# Patient Record
Sex: Female | Born: 1960 | Race: White | Hispanic: No | Marital: Married | Smoking: Never smoker
Health system: Southern US, Community
[De-identification: ages and names within clinical notes are randomized; demographics above are authoritative.]

## PROBLEM LIST (undated history)

## (undated) DIAGNOSIS — K648 Other hemorrhoids: Secondary | ICD-10-CM

## (undated) DIAGNOSIS — K5790 Diverticulosis of intestine, part unspecified, without perforation or abscess without bleeding: Secondary | ICD-10-CM

## (undated) DIAGNOSIS — D72819 Decreased white blood cell count, unspecified: Secondary | ICD-10-CM

## (undated) HISTORY — DX: Diverticulosis of intestine, part unspecified, without perforation or abscess without bleeding: K57.90

## (undated) HISTORY — PX: COLONOSCOPY: SHX174

## (undated) HISTORY — PX: ENDOMETRIAL ABLATION: SHX621

## (undated) HISTORY — DX: Other hemorrhoids: K64.8

## (undated) HISTORY — DX: Decreased white blood cell count, unspecified: D72.819

## (undated) HISTORY — PX: TUBAL LIGATION: SHX77

---

## 2004-07-04 ENCOUNTER — Ambulatory Visit: Payer: Self-pay | Admitting: Obstetrics and Gynecology

## 2005-09-13 ENCOUNTER — Ambulatory Visit: Payer: Self-pay | Admitting: Obstetrics and Gynecology

## 2006-10-24 ENCOUNTER — Ambulatory Visit: Payer: Self-pay | Admitting: Obstetrics and Gynecology

## 2006-11-14 ENCOUNTER — Ambulatory Visit: Payer: Self-pay | Admitting: Obstetrics and Gynecology

## 2007-11-15 ENCOUNTER — Ambulatory Visit: Payer: Self-pay | Admitting: Obstetrics and Gynecology

## 2008-11-23 ENCOUNTER — Ambulatory Visit: Payer: Self-pay | Admitting: Obstetrics and Gynecology

## 2009-07-02 ENCOUNTER — Emergency Department: Payer: Self-pay | Admitting: Emergency Medicine

## 2009-12-16 ENCOUNTER — Ambulatory Visit: Payer: Self-pay | Admitting: Obstetrics and Gynecology

## 2010-12-30 ENCOUNTER — Ambulatory Visit: Payer: Self-pay | Admitting: Obstetrics and Gynecology

## 2012-01-01 ENCOUNTER — Ambulatory Visit: Payer: Self-pay | Admitting: Obstetrics and Gynecology

## 2013-01-01 ENCOUNTER — Ambulatory Visit: Payer: Self-pay | Admitting: Obstetrics and Gynecology

## 2014-01-22 ENCOUNTER — Ambulatory Visit: Payer: Self-pay | Admitting: Obstetrics and Gynecology

## 2014-12-31 ENCOUNTER — Other Ambulatory Visit: Payer: Self-pay | Admitting: Obstetrics and Gynecology

## 2014-12-31 DIAGNOSIS — Z1231 Encounter for screening mammogram for malignant neoplasm of breast: Secondary | ICD-10-CM

## 2015-01-25 ENCOUNTER — Ambulatory Visit
Admission: RE | Admit: 2015-01-25 | Discharge: 2015-01-25 | Disposition: A | Discharge status: Home / Self Care | Payer: 59 | Source: Ambulatory Visit | Attending: Obstetrics and Gynecology | Admitting: Obstetrics and Gynecology

## 2015-01-25 DIAGNOSIS — Z1231 Encounter for screening mammogram for malignant neoplasm of breast: Secondary | ICD-10-CM | POA: Diagnosis present

## 2015-12-28 ENCOUNTER — Other Ambulatory Visit: Payer: Self-pay | Admitting: Obstetrics and Gynecology

## 2015-12-28 DIAGNOSIS — Z1231 Encounter for screening mammogram for malignant neoplasm of breast: Secondary | ICD-10-CM

## 2016-01-26 ENCOUNTER — Ambulatory Visit
Admission: RE | Admit: 2016-01-26 | Discharge: 2016-01-26 | Disposition: A | Discharge status: Home / Self Care | Payer: 59 | Source: Ambulatory Visit | Attending: Obstetrics and Gynecology | Admitting: Obstetrics and Gynecology

## 2016-01-26 ENCOUNTER — Other Ambulatory Visit: Payer: Self-pay | Admitting: Obstetrics and Gynecology

## 2016-01-26 DIAGNOSIS — Z1231 Encounter for screening mammogram for malignant neoplasm of breast: Secondary | ICD-10-CM

## 2016-03-03 ENCOUNTER — Ambulatory Visit: Payer: 59 | Admitting: Internal Medicine

## 2016-03-09 ENCOUNTER — Inpatient Hospital Stay: Payer: 59 | Attending: Internal Medicine | Admitting: Internal Medicine

## 2016-03-09 ENCOUNTER — Encounter: Payer: Self-pay | Admitting: *Deleted

## 2016-03-09 ENCOUNTER — Encounter (INDEPENDENT_AMBULATORY_CARE_PROVIDER_SITE_OTHER): Payer: Self-pay

## 2016-03-09 DIAGNOSIS — D649 Anemia, unspecified: Secondary | ICD-10-CM

## 2016-03-09 DIAGNOSIS — K5790 Diverticulosis of intestine, part unspecified, without perforation or abscess without bleeding: Secondary | ICD-10-CM

## 2016-03-09 DIAGNOSIS — D72819 Decreased white blood cell count, unspecified: Secondary | ICD-10-CM | POA: Insufficient documentation

## 2016-03-09 DIAGNOSIS — Z79899 Other long term (current) drug therapy: Secondary | ICD-10-CM | POA: Insufficient documentation

## 2016-03-09 DIAGNOSIS — K649 Unspecified hemorrhoids: Secondary | ICD-10-CM | POA: Diagnosis not present

## 2016-03-09 NOTE — Assessment & Plan Note (Addendum)
White count 3.6-3.8-normal differential; normal hemoglobin and platelets. Unclear etiology; however asymptomatic. Recommend checking CBC with differential/viral studies including hepatitis and HIV; LDH. Discussed the potential- causes including but not limited to benign versus malignant causes.   # For now recommend checking CBC every 6 weeks; follow up with me in 3 months. If her labs are abnormal/continued trend down recommend a bone marrow biopsy; CT scans. This was also discussed.  # Patient will have labs and through [place]. Follow-up with me in 3 months.  Thank you Dr.Schermerhorn for allowing me to participate in the care of your pleasant patient. Please do not hesitate to contact me with questions or concerns in the interim.

## 2016-03-09 NOTE — Progress Notes (Signed)
[facility] NOTE  Patient Care Team: Boykin Nearing, MD as PCP - General (Obstetrics and Gynecology)  CHIEF COMPLAINTS/PURPOSE OF CONSULTATION:    # LEUCOPENIA-   #    No history exists.     HISTORY OF PRESENTING ILLNESS:  Kristie Hampton 55 y.o.  female with no significant past medical problems-noted to have slightly low white count on a regular physical exams/labs through her gynecologist. She has been referred to [place] for further workup.  Patient denies any new medications. Denies new onset of weight loss or night sweats or fevers. No frequent infections. Denies any significant alcohol abuse.   Recently had joint pain deferred to orthopedic. ROS: A complete 10 point review of system is done which is negative except mentioned above in history of present illness  MEDICAL HISTORY:  Past Medical History:  Diagnosis Date  . Diverticulosis   . Internal hemorrhoids   . Leukopenia     SURGICAL HISTORY: Past Surgical History:  Procedure Laterality Date  . COLONOSCOPY    . ENDOMETRIAL ABLATION    . TUBAL LIGATION      SOCIAL HISTORY: [facility]; no smoking; occasional alcohol. Lives in [place].  Social History   Social History  . Marital status: Married    Spouse name: N/A  . Number of children: N/A  . Years of education: N/A   Occupational History  . Not on file.   Social History Main Topics  . Smoking status: Never Smoker  . Smokeless tobacco: Never Used  . Alcohol use 1.8 oz/week    3 Glasses of wine per week  . Drug use: No  . Sexual activity: Not on file   Other Topics Concern  . Not on file   Social History Narrative  . No narrative on file    FAMILY HISTORY: Family History  Problem Relation Age of Onset  . Breast cancer Maternal Grandmother 61  . Heart disease Father     ALLERGIES:  is allergic to codeine.  MEDICATIONS:  Current Outpatient Prescriptions  Medication Sig Dispense Refill  . Calcium  Carbonate-Vitamin D (CALTRATE 600+D) 600-400 MG-UNIT tablet Take 1 tablet by mouth daily.    Marland Kitchen esomeprazole (NEXIUM) 20 MG capsule Take 20 mg by mouth daily at 12 noon.    . fexofenadine (ALLEGRA) 30 MG tablet Take 30 mg by mouth 2 (two) times daily.    . meloxicam (MOBIC) 7.5 MG tablet Take by mouth.    . Multiple Vitamin (MULTI-VITAMINS) TABS Take by mouth.     No current facility-administered medications for this visit.       Marland Kitchen  PHYSICAL EXAMINATION:   Vitals:   03/09/16 1413  BP: (!) 164/99  Pulse: 78  Resp: 16  Temp: 97.3 F (36.3 C)   Filed Weights   03/09/16 1413  Weight: 149 lb 12.8 oz (67.9 kg)    GENERAL: Well-nourished well-developed; Alert, no distress and comfortable.   Alone.  EYES: no pallor or icterus OROPHARYNX: no thrush or ulceration; good dentition  NECK: supple, no masses felt LYMPH:  no palpable lymphadenopathy in the cervical, axillary or inguinal regions LUNGS: clear to auscultation and  No wheeze or crackles HEART/CVS: regular rate & rhythm and no murmurs; No lower extremity edema ABDOMEN: abdomen soft, non-tender and normal bowel sounds Musculoskeletal:no cyanosis of digits and no clubbing  PSYCH: alert & oriented x 3 with fluent speech NEURO: no focal motor/sensory deficits SKIN:  no rashes or significant lesions  LABORATORY DATA:  I  have reviewed the data as listed No results found for: WBC, HGB, HCT, MCV, PLT No results for input(s): NA, K, CL, CO2, GLUCOSE, BUN, CREATININE, CALCIUM, GFRNONAA, GFRAA, PROT, ALBUMIN, AST, ALT, ALKPHOS, BILITOT, BILIDIR, IBILI in the last 8760 hours.  RADIOGRAPHIC STUDIES: I have personally reviewed the radiological images as listed and agreed with the findings in the report. No results found.  ASSESSMENT & PLAN:   Leucopenia White count 3.6-3.8-normal differential; normal hemoglobin and platelets. Unclear etiology; however asymptomatic. Recommend checking CBC with differential/viral studies including  hepatitis and HIV; LDH. Discussed the potential- causes including but not limited to benign versus malignant causes.   # For now recommend checking CBC every 6 weeks; follow up with me in 3 months. If her labs are abnormal/continued trend down recommend a bone marrow biopsy. This was also discussed.  # Patient will have labs and through [facility]. Follow-up with me in 3 months.  Thank you for allowing me to participate in the care of your pleasant patient. Please do not hesitate to contact me with questions or concerns in the interim.     All questions were answered. The patient knows to call the clinic with any problems, questions or concerns.       Cammie Sickle, MD 03/09/2016 5:25 PM

## 2016-03-10 ENCOUNTER — Encounter: Payer: Self-pay | Admitting: Internal Medicine

## 2016-03-17 ENCOUNTER — Telehealth: Payer: Self-pay | Admitting: Internal Medicine

## 2016-03-17 ENCOUNTER — Other Ambulatory Visit: Payer: Self-pay | Admitting: Internal Medicine

## 2016-03-17 NOTE — Telephone Encounter (Signed)
Please inform patient that labs within normal limits; repeat labs as planned. Recommendations

## 2016-03-21 ENCOUNTER — Telehealth: Payer: Self-pay | Admitting: Internal Medicine

## 2016-03-21 NOTE — Telephone Encounter (Signed)
Left vm-for patient return our call.

## 2016-03-21 NOTE — Telephone Encounter (Signed)
She returned your call about her results and asks that you please call her at work: [phone].

## 2016-03-21 NOTE — Telephone Encounter (Signed)
Returned pt's call [phone] with preferred phone #.  Labs wnl. Keep scheduled lab apts @ [facility] and scheduled provider apts.  Teach back process performed with pt.

## 2016-03-21 NOTE — Telephone Encounter (Signed)
Call returned. See Rn notes

## 2016-04-28 ENCOUNTER — Encounter: Payer: Self-pay | Admitting: Internal Medicine

## 2016-06-12 ENCOUNTER — Inpatient Hospital Stay: Payer: 59 | Admitting: Internal Medicine

## 2016-06-14 ENCOUNTER — Encounter: Payer: Self-pay | Admitting: Internal Medicine

## 2016-06-16 ENCOUNTER — Other Ambulatory Visit: Payer: Self-pay | Admitting: Internal Medicine

## 2016-06-19 ENCOUNTER — Encounter (INDEPENDENT_AMBULATORY_CARE_PROVIDER_SITE_OTHER): Payer: Self-pay

## 2016-06-19 ENCOUNTER — Inpatient Hospital Stay: Payer: 59 | Attending: Internal Medicine | Admitting: Internal Medicine

## 2016-06-19 DIAGNOSIS — K5790 Diverticulosis of intestine, part unspecified, without perforation or abscess without bleeding: Secondary | ICD-10-CM | POA: Diagnosis not present

## 2016-06-19 DIAGNOSIS — Z79899 Other long term (current) drug therapy: Secondary | ICD-10-CM | POA: Insufficient documentation

## 2016-06-19 DIAGNOSIS — D72818 Other decreased white blood cell count: Secondary | ICD-10-CM

## 2016-06-19 DIAGNOSIS — Z803 Family history of malignant neoplasm of breast: Secondary | ICD-10-CM | POA: Diagnosis not present

## 2016-06-19 DIAGNOSIS — D72819 Decreased white blood cell count, unspecified: Secondary | ICD-10-CM | POA: Insufficient documentation

## 2016-06-19 DIAGNOSIS — M2550 Pain in unspecified joint: Secondary | ICD-10-CM | POA: Insufficient documentation

## 2016-06-19 DIAGNOSIS — K649 Unspecified hemorrhoids: Secondary | ICD-10-CM | POA: Diagnosis not present

## 2016-06-19 NOTE — Progress Notes (Signed)
Patient here today for follow up.   

## 2016-06-19 NOTE — Assessment & Plan Note (Signed)
White count 3.6-3.8-normal differential- at presentation. Hepatitis workup HIV negative.   Repeat follow-up workup shows normal white count 4.2 hemoglobin normal platelets normal.   Patient likely had benign transient decrease in the WBC count resolved. I would not recommend any further workup including bone marrow or CT scans. Patient is totally asymptomatic.   # Recommend follow-up only as needed.

## 2016-06-19 NOTE — Progress Notes (Signed)
[facility] CONSULT NOTE  Patient Care Team: Suzy Bouchardhomas J Schermerhorn, MD as PCP - General (Obstetrics and Gynecology)  CHIEF COMPLAINTS/PURPOSE OF CONSULTATION:    # LEUCOPENIA-   #    No history exists.     HISTORY OF PRESENTING ILLNESS:  Kristie Hampton 55 y.o.  female with no significant past medical problems-noted to have slightly low white count on a regular physical exams- Is currently a follow-up.  Patient denies any fevers; Patient denies any new medications. Denies new onset of weight loss or night sweats or fevers. No frequent infections. Denies any significant alcohol abuse.   Recently had joint pain deferred to orthopedic. ROS: A complete 10 point review of system is done which is negative except mentioned above in history of present illness  MEDICAL HISTORY:  Past Medical History:  Diagnosis Date  . Diverticulosis   . Internal hemorrhoids   . Leukopenia     SURGICAL HISTORY: Past Surgical History:  Procedure Laterality Date  . COLONOSCOPY    . ENDOMETRIAL ABLATION    . TUBAL LIGATION      SOCIAL HISTORY: [facility]; no smoking; occasional alcohol. [place]  Social History   Social History  . Marital status: Married    Spouse name: N/A  . Number of children: N/A  . Years of education: N/A   Occupational History  . Not on file.   Social History Main Topics  . Smoking status: Never Smoker  . Smokeless tobacco: Never Used  . Alcohol use 1.8 oz/week    3 Glasses of wine per week  . Drug use: No  . Sexual activity: Not on file   Other Topics Concern  . Not on file   Social History Narrative  . No narrative on file    FAMILY HISTORY: Family History  Problem Relation Age of Onset  . Breast cancer Maternal Grandmother [id number]  . Heart disease Father     ALLERGIES:  is allergic to codeine.  MEDICATIONS:  Current Outpatient Prescriptions  Medication Sig Dispense Refill  . Calcium Carbonate-Vitamin D (CALTRATE 600+D)  600-400 MG-UNIT tablet Take 1 tablet by mouth daily.    Marland Kitchen. esomeprazole (NEXIUM) 20 MG capsule Take 20 mg by mouth daily at 12 noon.    . fexofenadine (ALLEGRA) 30 MG tablet Take 30 mg by mouth 2 (two) times daily.    . meloxicam (MOBIC) 7.5 MG tablet Take by mouth.    . Multiple Vitamin (MULTI-VITAMINS) TABS Take by mouth.     No current facility-administered medications for this visit.       Marland Kitchen.  PHYSICAL EXAMINATION:   Vitals:   06/19/16 1559  BP: (!) 166/106  Pulse: 83  Temp: 98.4 F (36.9 C)   Filed Weights   06/19/16 1559  Weight: 152 lb 2 oz (69 kg)    GENERAL: Well-nourished well-developed; Alert, no distress and comfortable.   Alone.  EYES: no pallor or icterus OROPHARYNX: no thrush or ulceration; good dentition  NECK: supple, no masses felt LYMPH:  no palpable lymphadenopathy in the cervical, axillary or inguinal regions LUNGS: clear to auscultation and  No wheeze or crackles HEART/CVS: regular rate & rhythm and no murmurs; No lower extremity edema ABDOMEN: abdomen soft, non-tender and normal bowel sounds Musculoskeletal:no cyanosis of digits and no clubbing  PSYCH: alert & oriented x 3 with fluent speech NEURO: no focal motor/sensory deficits SKIN:  no rashes or significant lesions  LABORATORY DATA:  I have reviewed the data as listed No  results found for: WBC, HGB, HCT, MCV, PLT No results for input(s): NA, K, CL, CO2, GLUCOSE, BUN, CREATININE, CALCIUM, GFRNONAA, GFRAA, PROT, ALBUMIN, AST, ALT, ALKPHOS, BILITOT, BILIDIR, IBILI in the last 8760 hours.  RADIOGRAPHIC STUDIES: I have personally reviewed the radiological images as listed and agreed with the findings in the report. No results found.  ASSESSMENT & PLAN:   Other decreased white blood cell count (CODE) White count 3.6-3.8-normal differential- at presentation. Hepatitis workup HIV negative.   Repeat follow-up workup shows normal white count 4.2 hemoglobin normal platelets normal.   Patient  likely had benign transient decrease in the WBC count resolved. I would not recommend any further workup including bone marrow or CT scans. Patient is totally asymptomatic.   # Recommend follow-up only as needed.   All questions were answered. The patient knows to call the clinic with any problems, questions or concerns.     Earna CoderGovinda R Sydney Hasten, MD 06/20/2016 9:55 AM

## 2017-03-29 ENCOUNTER — Other Ambulatory Visit: Payer: Self-pay | Admitting: Obstetrics and Gynecology

## 2017-03-29 DIAGNOSIS — Z1231 Encounter for screening mammogram for malignant neoplasm of breast: Secondary | ICD-10-CM

## 2017-04-10 ENCOUNTER — Ambulatory Visit
Admission: RE | Admit: 2017-04-10 | Discharge: 2017-04-10 | Disposition: A | Discharge status: Home / Self Care | Payer: 59 | Source: Ambulatory Visit | Attending: Obstetrics and Gynecology | Admitting: Obstetrics and Gynecology

## 2017-04-10 DIAGNOSIS — Z1231 Encounter for screening mammogram for malignant neoplasm of breast: Secondary | ICD-10-CM | POA: Diagnosis present

## 2018-04-09 ENCOUNTER — Other Ambulatory Visit: Payer: Self-pay | Admitting: Obstetrics and Gynecology

## 2018-04-09 DIAGNOSIS — Z1231 Encounter for screening mammogram for malignant neoplasm of breast: Secondary | ICD-10-CM

## 2018-04-17 ENCOUNTER — Ambulatory Visit
Admission: RE | Admit: 2018-04-17 | Discharge: 2018-04-17 | Disposition: A | Discharge status: Home / Self Care | Payer: BLUE CROSS/BLUE SHIELD | Source: Ambulatory Visit | Attending: Obstetrics and Gynecology | Admitting: Obstetrics and Gynecology

## 2018-04-17 DIAGNOSIS — Z1231 Encounter for screening mammogram for malignant neoplasm of breast: Secondary | ICD-10-CM | POA: Insufficient documentation

## 2019-06-24 ENCOUNTER — Other Ambulatory Visit: Payer: Self-pay | Admitting: Obstetrics and Gynecology

## 2019-06-24 DIAGNOSIS — Z1231 Encounter for screening mammogram for malignant neoplasm of breast: Secondary | ICD-10-CM

## 2019-07-01 ENCOUNTER — Other Ambulatory Visit: Payer: Self-pay | Admitting: Obstetrics and Gynecology

## 2019-07-01 ENCOUNTER — Ambulatory Visit
Admission: RE | Admit: 2019-07-01 | Discharge: 2019-07-01 | Disposition: A | Discharge status: Home / Self Care | Payer: BC Managed Care – PPO | Source: Ambulatory Visit | Attending: Obstetrics and Gynecology | Admitting: Obstetrics and Gynecology

## 2019-07-01 DIAGNOSIS — R928 Other abnormal and inconclusive findings on diagnostic imaging of breast: Secondary | ICD-10-CM

## 2019-07-01 DIAGNOSIS — N6310 Unspecified lump in the right breast, unspecified quadrant: Secondary | ICD-10-CM

## 2019-07-01 DIAGNOSIS — Z1231 Encounter for screening mammogram for malignant neoplasm of breast: Secondary | ICD-10-CM | POA: Insufficient documentation

## 2019-07-22 ENCOUNTER — Other Ambulatory Visit: Payer: BC Managed Care – PPO

## 2019-07-22 ENCOUNTER — Ambulatory Visit: Payer: BC Managed Care – PPO

## 2019-07-28 ENCOUNTER — Ambulatory Visit
Admission: RE | Admit: 2019-07-28 | Discharge: 2019-07-28 | Disposition: A | Discharge status: Home / Self Care | Payer: BC Managed Care – PPO | Source: Ambulatory Visit | Attending: Obstetrics and Gynecology | Admitting: Obstetrics and Gynecology

## 2019-07-28 DIAGNOSIS — R928 Other abnormal and inconclusive findings on diagnostic imaging of breast: Secondary | ICD-10-CM

## 2019-07-28 DIAGNOSIS — N6310 Unspecified lump in the right breast, unspecified quadrant: Secondary | ICD-10-CM

## 2019-10-17 ENCOUNTER — Ambulatory Visit: Payer: BC Managed Care – PPO | Attending: Internal Medicine

## 2019-10-17 DIAGNOSIS — Z23 Encounter for immunization: Secondary | ICD-10-CM

## 2019-10-17 NOTE — Progress Notes (Signed)
   Covid-19 Vaccination Clinic  Name:  Kristie Hampton    MRN: [id number] DOB: 1961/04/01  10/17/2019  [name] was observed post Covid-19 immunization for 15 minutes without incident. She was provided with Vaccine Information Sheet and instruction to access the V-Safe system.   [name] was instructed to call 911 with any severe reactions post vaccine: Marland Kitchen Difficulty breathing  . Swelling of face and throat  . A fast heartbeat  . A bad rash all over body  . Dizziness and weakness   Immunizations Administered    Name Date Dose VIS Date Route   Pfizer COVID-19 Vaccine 10/17/2019 11:24 AM 0.3 mL 06/27/2019 Intramuscular   Manufacturer: [employer], [employer]   Lot: [id number]   NDC: [address]-1

## 2019-11-11 ENCOUNTER — Ambulatory Visit: Payer: BC Managed Care – PPO | Attending: Internal Medicine

## 2019-11-11 DIAGNOSIS — Z23 Encounter for immunization: Secondary | ICD-10-CM

## 2019-11-11 NOTE — Progress Notes (Signed)
   Covid-19 Vaccination Clinic  Name:  Gimena Buick    MRN: [id number] DOB: 09/29/1960  11/11/2019  Ms. Comley was observed post Covid-19 immunization for 15 minutes without incident. She was provided with Vaccine Information Sheet and instruction to access the V-Safe system.   Ms. Ashmore was instructed to call 911 with any severe reactions post vaccine: Marland Kitchen Difficulty breathing  . Swelling of face and throat  . A fast heartbeat  . A bad rash all over body  . Dizziness and weakness   Immunizations Administered    Name Date Dose VIS Date Route   Pfizer COVID-19 Vaccine 11/11/2019  4:02 PM 0.3 mL 09/10/2018 Intramuscular   Manufacturer: [employer], [employer]   Lot: [id number]   NDC: [address]-5

## 2020-06-29 ENCOUNTER — Other Ambulatory Visit: Payer: Self-pay | Admitting: Obstetrics and Gynecology

## 2020-06-29 ENCOUNTER — Other Ambulatory Visit: Payer: Self-pay | Admitting: Orthopedic Surgery

## 2020-06-29 DIAGNOSIS — Z1231 Encounter for screening mammogram for malignant neoplasm of breast: Secondary | ICD-10-CM

## 2020-07-14 ENCOUNTER — Other Ambulatory Visit: Payer: Self-pay

## 2020-07-14 ENCOUNTER — Ambulatory Visit
Admission: RE | Admit: 2020-07-14 | Discharge: 2020-07-14 | Disposition: A | Discharge status: Home / Self Care | Payer: BC Managed Care – PPO | Source: Ambulatory Visit | Attending: Obstetrics and Gynecology | Admitting: Obstetrics and Gynecology

## 2020-07-14 DIAGNOSIS — Z1231 Encounter for screening mammogram for malignant neoplasm of breast: Secondary | ICD-10-CM | POA: Insufficient documentation

## 2021-08-10 ENCOUNTER — Other Ambulatory Visit: Payer: Self-pay | Admitting: Obstetrics and Gynecology

## 2021-08-10 DIAGNOSIS — Z1231 Encounter for screening mammogram for malignant neoplasm of breast: Secondary | ICD-10-CM

## 2021-08-23 ENCOUNTER — Other Ambulatory Visit: Payer: Self-pay

## 2021-08-23 ENCOUNTER — Ambulatory Visit
Admission: RE | Admit: 2021-08-23 | Discharge: 2021-08-23 | Disposition: A | Discharge status: Home / Self Care | Payer: BC Managed Care – PPO | Source: Ambulatory Visit | Attending: Obstetrics and Gynecology | Admitting: Obstetrics and Gynecology

## 2021-08-23 DIAGNOSIS — Z1231 Encounter for screening mammogram for malignant neoplasm of breast: Secondary | ICD-10-CM | POA: Insufficient documentation

## 2022-08-14 IMAGING — MG MM DIGITAL SCREENING BILAT W/ TOMO AND CAD
8 series · 8 of 24 positions shown · non-contrast
Comparison: Previous exam(s).

CLINICAL DATA: Screening.

EXAM:
DIGITAL SCREENING BILATERAL MAMMOGRAM WITH TOMOSYNTHESIS AND CAD
TECHNIQUE: Bilateral screening digital craniocaudal and mediolateral oblique
mammograms were obtained. Bilateral screening digital breast
tomosynthesis was performed. The images were evaluated with
computer-aided detection.

[L CC synth-2D]
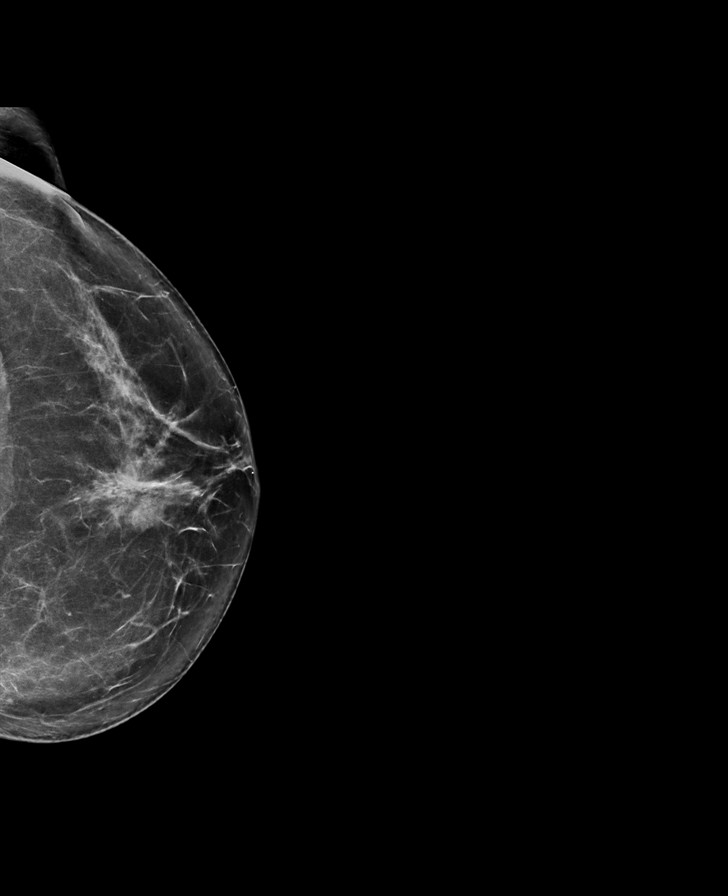

[R MLO synth-2D]
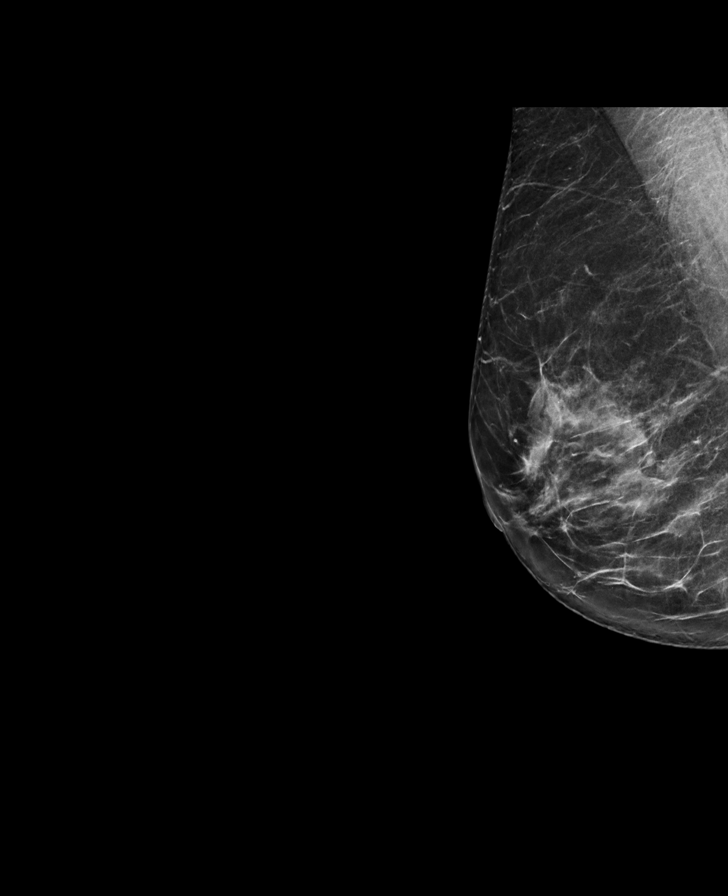

[R CC synth-2D]
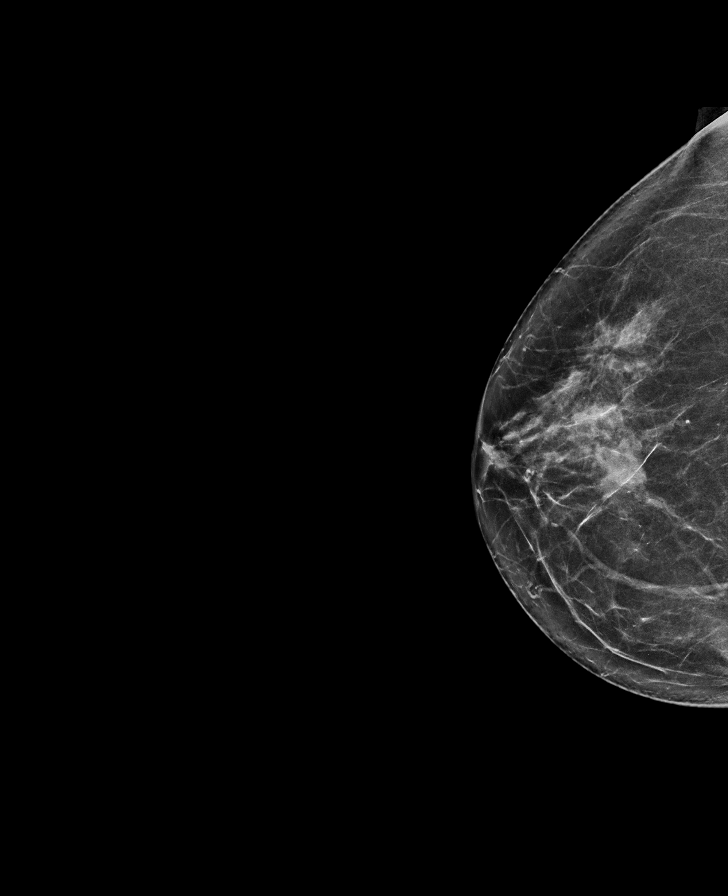

[L MLO synth-2D]
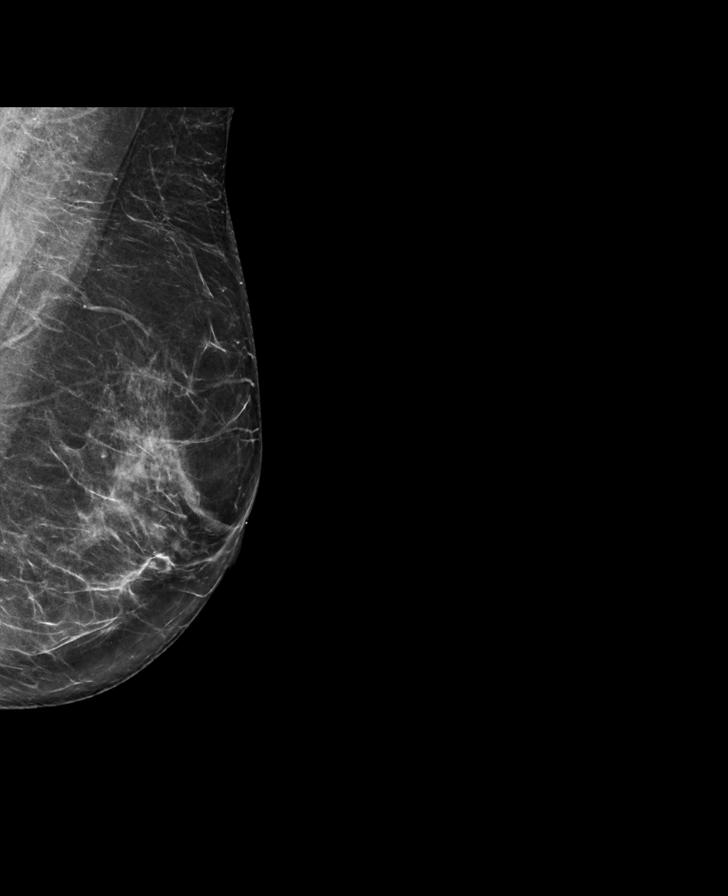

[L CC tomo · tomo slice 38/75.0]
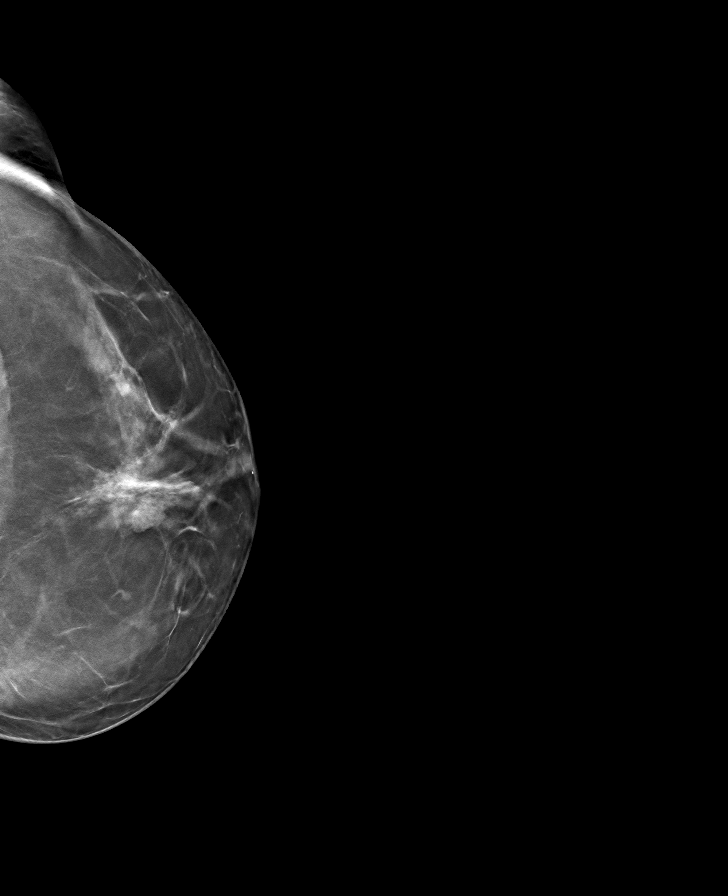

[R MLO tomo · tomo slice 35/70.0]
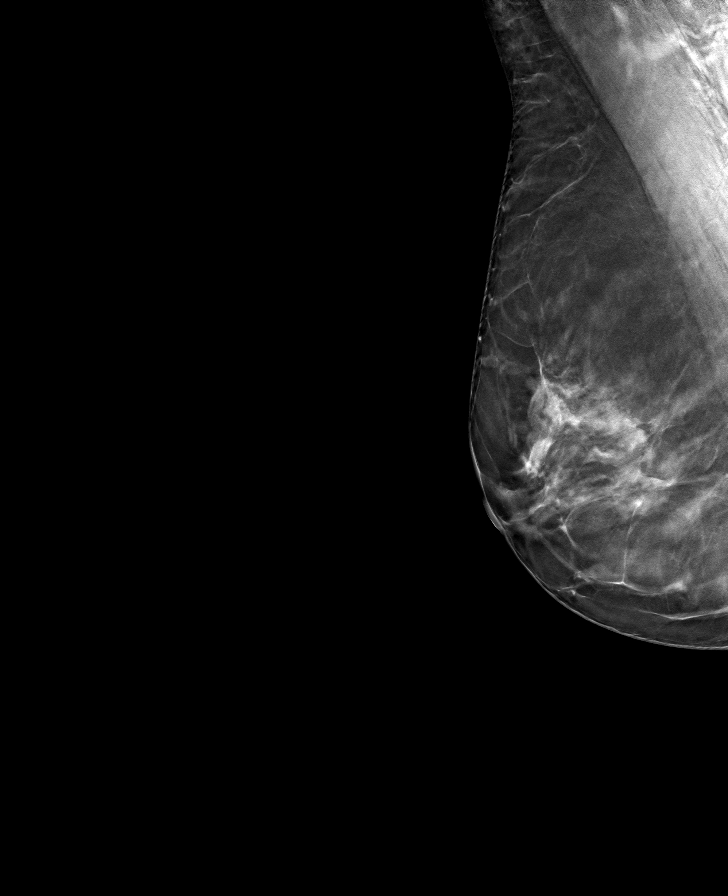

[R CC tomo · tomo slice 36/71.0]
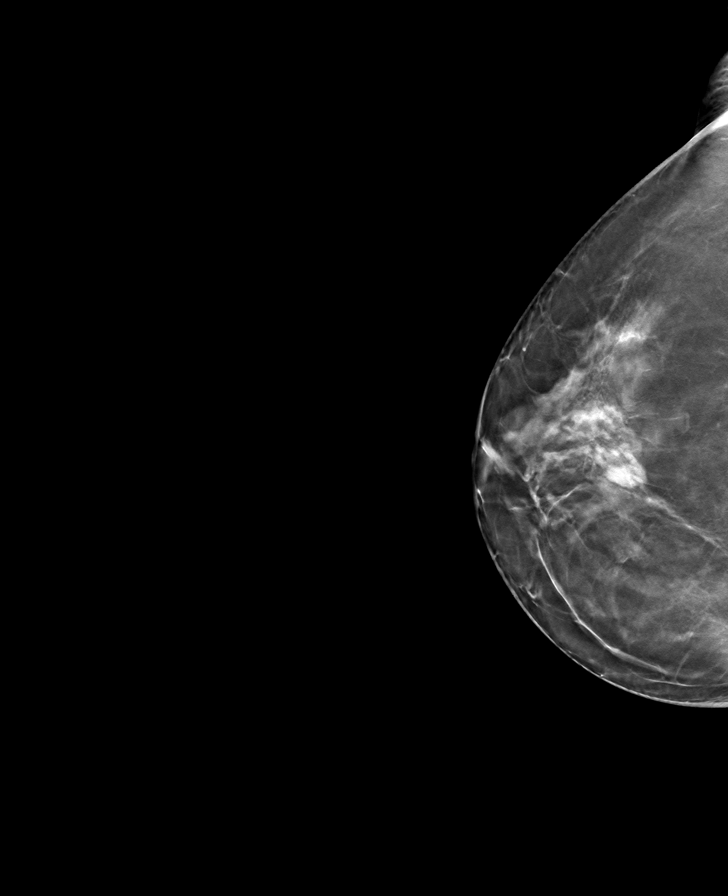

[L MLO tomo · tomo slice 36/71.0]
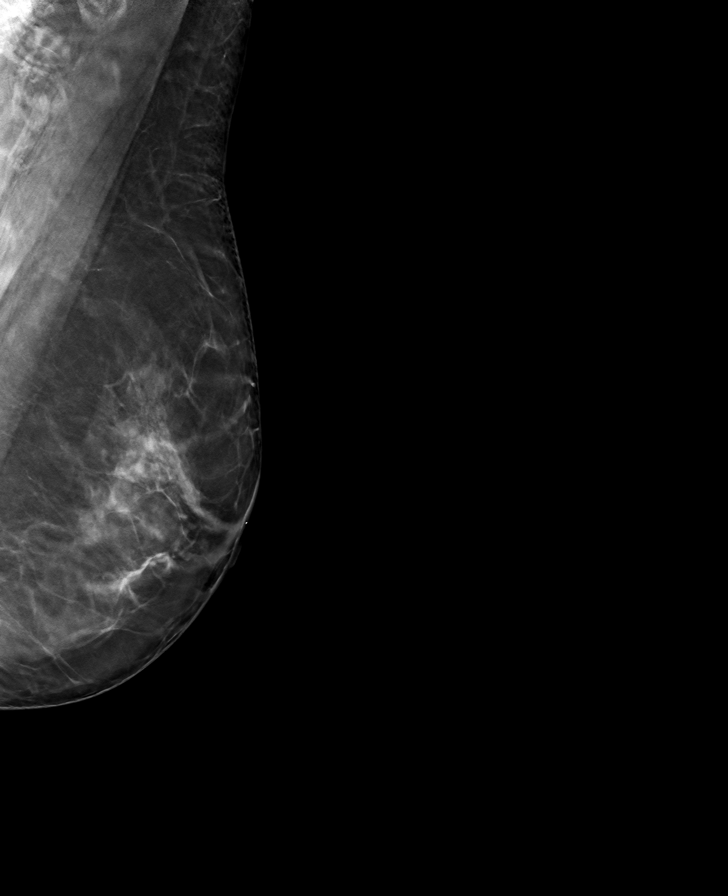

[8 of 24 positions shown; findings below may reference images not displayed]

ACR Breast Density Category c: The breast tissue is heterogeneously
dense, which may obscure small masses.
FINDINGS: There are no findings suspicious for malignancy.
IMPRESSION: No mammographic evidence of malignancy. A result letter of this
screening mammogram will be mailed directly to the patient.

RECOMMENDATION:
Screening mammogram in one year. (Code:Q3-W-BC3)

BI-RADS CATEGORY  1: Negative.

## 2022-08-15 ENCOUNTER — Other Ambulatory Visit: Payer: Self-pay | Admitting: Obstetrics and Gynecology

## 2022-08-15 DIAGNOSIS — Z1231 Encounter for screening mammogram for malignant neoplasm of breast: Secondary | ICD-10-CM

## 2022-09-05 ENCOUNTER — Ambulatory Visit
Admission: RE | Admit: 2022-09-05 | Discharge: 2022-09-05 | Disposition: A | Discharge status: Home / Self Care | Payer: BC Managed Care – PPO | Source: Ambulatory Visit | Attending: Obstetrics and Gynecology | Admitting: Obstetrics and Gynecology

## 2022-09-05 DIAGNOSIS — Z1231 Encounter for screening mammogram for malignant neoplasm of breast: Secondary | ICD-10-CM | POA: Diagnosis present

## 2023-08-28 ENCOUNTER — Other Ambulatory Visit: Payer: Self-pay | Admitting: Obstetrics and Gynecology

## 2023-08-28 DIAGNOSIS — Z1231 Encounter for screening mammogram for malignant neoplasm of breast: Secondary | ICD-10-CM

## 2023-09-11 ENCOUNTER — Ambulatory Visit
Admission: RE | Admit: 2023-09-11 | Discharge: 2023-09-11 | Disposition: A | Discharge status: Home / Self Care | Payer: 59 | Source: Ambulatory Visit | Attending: Obstetrics and Gynecology | Admitting: Obstetrics and Gynecology

## 2023-09-11 DIAGNOSIS — Z1231 Encounter for screening mammogram for malignant neoplasm of breast: Secondary | ICD-10-CM | POA: Insufficient documentation
